# Patient Record
Sex: Female | Born: 1955 | Race: Black or African American | Hispanic: No | Marital: Married | Smoking: Never smoker
Health system: Southern US, Community
[De-identification: ages and names within clinical notes are randomized; demographics above are authoritative.]

## PROBLEM LIST (undated history)

## (undated) DIAGNOSIS — N200 Calculus of kidney: Secondary | ICD-10-CM

## (undated) DIAGNOSIS — I10 Essential (primary) hypertension: Secondary | ICD-10-CM

---

## 2015-07-01 ENCOUNTER — Emergency Department: Payer: BLUE CROSS/BLUE SHIELD

## 2015-07-01 ENCOUNTER — Emergency Department
Admission: EM | Admit: 2015-07-01 | Discharge: 2015-07-02 | Disposition: A | Discharge status: Home / Self Care | Payer: BLUE CROSS/BLUE SHIELD | Attending: Emergency Medicine | Admitting: Emergency Medicine

## 2015-07-01 DIAGNOSIS — I10 Essential (primary) hypertension: Secondary | ICD-10-CM | POA: Insufficient documentation

## 2015-07-01 DIAGNOSIS — R112 Nausea with vomiting, unspecified: Secondary | ICD-10-CM | POA: Diagnosis present

## 2015-07-01 DIAGNOSIS — N200 Calculus of kidney: Secondary | ICD-10-CM | POA: Diagnosis not present

## 2015-07-01 DIAGNOSIS — E876 Hypokalemia: Secondary | ICD-10-CM | POA: Diagnosis present

## 2015-07-01 DIAGNOSIS — R109 Unspecified abdominal pain: Secondary | ICD-10-CM | POA: Insufficient documentation

## 2015-07-01 DIAGNOSIS — N201 Calculus of ureter: Secondary | ICD-10-CM | POA: Diagnosis present

## 2015-07-01 HISTORY — DX: Essential (primary) hypertension: I10

## 2015-07-01 HISTORY — DX: Calculus of kidney: N20.0

## 2015-07-01 LAB — URINALYSIS COMPLETE WITH MICROSCOPIC (ARMC ONLY)
BACTERIA UA: NONE SEEN
Bilirubin Urine: NEGATIVE
Glucose, UA: NEGATIVE mg/dL
HGB URINE DIPSTICK: NEGATIVE
Ketones, ur: NEGATIVE mg/dL
Leukocytes, UA: NEGATIVE
NITRITE: NEGATIVE
PROTEIN: NEGATIVE mg/dL
SPECIFIC GRAVITY, URINE: 1.015 (ref 1.005–1.030)
pH: 9 — ABNORMAL HIGH (ref 5.0–8.0)

## 2015-07-01 LAB — CBC
HEMATOCRIT: 36.2 % (ref 35.0–47.0)
Hemoglobin: 12.2 g/dL (ref 12.0–16.0)
MCH: 32.6 pg (ref 26.0–34.0)
MCHC: 33.6 g/dL (ref 32.0–36.0)
MCV: 96.9 fL (ref 80.0–100.0)
PLATELETS: 206 10*3/uL (ref 150–440)
RBC: 3.73 MIL/uL — ABNORMAL LOW (ref 3.80–5.20)
RDW: 13.5 % (ref 11.5–14.5)
WBC: 6.3 10*3/uL (ref 3.6–11.0)

## 2015-07-01 LAB — COMPREHENSIVE METABOLIC PANEL
ALBUMIN: 4.1 g/dL (ref 3.5–5.0)
ALT: 14 U/L (ref 14–54)
AST: 26 U/L (ref 15–41)
Alkaline Phosphatase: 59 U/L (ref 38–126)
Anion gap: 4 — ABNORMAL LOW (ref 5–15)
BUN: 20 mg/dL (ref 6–20)
CHLORIDE: 108 mmol/L (ref 101–111)
CO2: 25 mmol/L (ref 22–32)
Calcium: 9.2 mg/dL (ref 8.9–10.3)
Creatinine, Ser: 0.9 mg/dL (ref 0.44–1.00)
GFR calc Af Amer: >60 mL/min (ref >60)
GFR calc non Af Amer: >60 mL/min (ref >60)
GLUCOSE: 140 mg/dL — AB (ref 65–99)
POTASSIUM: 3.4 mmol/L — AB (ref 3.5–5.1)
SODIUM: 137 mmol/L (ref 135–145)
Total Bilirubin: 0.6 mg/dL (ref 0.3–1.2)
Total Protein: 7.2 g/dL (ref 6.5–8.1)

## 2015-07-01 LAB — LIPASE, BLOOD: Lipase: 18 U/L (ref 11–51)

## 2015-07-01 MED ORDER — HYDROMORPHONE HCL 1 MG/ML IJ SOLN
INTRAMUSCULAR | Status: AC
Start: 2015-07-01 — End: 2015-07-02
  Administered 2015-07-02: 1 mg
  Filled 2015-07-01: qty 1

## 2015-07-01 MED ORDER — HYDROMORPHONE HCL 1 MG/ML IJ SOLN
0.5000 mg | Freq: Once | INTRAMUSCULAR | Status: AC
  Administered 2015-07-01: 0.5 mg via INTRAVENOUS

## 2015-07-01 MED ORDER — SODIUM CHLORIDE 0.9 % IV BOLUS (SEPSIS)
1000.0000 mL | Freq: Once | INTRAVENOUS | Status: AC
  Administered 2015-07-01: 1000 mL via INTRAVENOUS

## 2015-07-01 MED ORDER — HYDROMORPHONE HCL 1 MG/ML IJ SOLN
1.0000 mg | Freq: Once | INTRAMUSCULAR | Status: AC
  Administered 2015-07-01: 1 mg via INTRAVENOUS
  Filled 2015-07-01: qty 1

## 2015-07-01 MED ORDER — ONDANSETRON HCL 4 MG/2ML IJ SOLN
4.0000 mg | Freq: Once | INTRAMUSCULAR | Status: AC
  Administered 2015-07-01: 4 mg via INTRAVENOUS
  Filled 2015-07-01: qty 2

## 2015-07-01 NOTE — ED Notes (Signed)
Patient transported to CT 

## 2015-07-01 NOTE — ED Notes (Signed)
Pt presents via EMS c/o right flank and back pain. Pt reports similar pain to last kidney stone. Quale MD at bedside.

## 2015-07-02 ENCOUNTER — Encounter: Payer: Self-pay | Admitting: Internal Medicine

## 2015-07-02 DIAGNOSIS — R112 Nausea with vomiting, unspecified: Secondary | ICD-10-CM | POA: Diagnosis present

## 2015-07-02 DIAGNOSIS — N201 Calculus of ureter: Secondary | ICD-10-CM | POA: Diagnosis present

## 2015-07-02 DIAGNOSIS — E876 Hypokalemia: Secondary | ICD-10-CM | POA: Diagnosis present

## 2015-07-02 MED ORDER — ONDANSETRON HCL 4 MG/2ML IJ SOLN
4.0000 mg | Freq: Once | INTRAMUSCULAR | Status: AC
  Administered 2015-07-02: 4 mg via INTRAVENOUS
  Filled 2015-07-02: qty 2

## 2015-07-02 MED ORDER — HYDROCODONE-ACETAMINOPHEN 5-325 MG PO TABS
1.0000 | ORAL_TABLET | Freq: Four times a day (QID) | ORAL | Status: DC | PRN
Start: 2015-07-02 — End: 2015-07-02

## 2015-07-02 MED ORDER — ONDANSETRON 4 MG PO TBDP: 4.0000 mg | ORAL_TABLET | Freq: Four times a day (QID) | ORAL | Status: DC | PRN

## 2015-07-02 MED ORDER — POTASSIUM CHLORIDE 20 MEQ/15ML (10%) PO SOLN
40.0000 meq | Freq: Once | ORAL | Status: AC
  Administered 2015-07-02: 40 meq via ORAL
  Filled 2015-07-02: qty 30

## 2015-07-02 MED ORDER — HYDROMORPHONE HCL 1 MG/ML IJ SOLN
0.5000 mg | INTRAMUSCULAR | Status: DC
Start: 2015-07-02 — End: 2015-07-02
  Filled 2015-07-02: qty 1

## 2015-07-02 MED ORDER — OXYCODONE-ACETAMINOPHEN 5-325 MG PO TABS
1.0000 | ORAL_TABLET | ORAL | Status: AC
  Administered 2015-07-02: 1 via ORAL
  Filled 2015-07-02: qty 1

## 2015-07-02 MED ORDER — ONDANSETRON 4 MG PO TBDP
4.0000 mg | ORAL_TABLET | Freq: Once | ORAL | Status: AC
  Administered 2015-07-02: 4 mg via ORAL
  Filled 2015-07-02: qty 1

## 2015-07-02 NOTE — Discharge Instructions (Signed)
You have been seen in the Emergency Department (ED) today for pain that we believe based on your workup, is caused by kidney stones.  As we have discussed, please drink plenty of fluids.  Please make a follow up appointment with the physician(s) listed elsewhere in this documentation.   We also recommend that you take over-the-counter ibuprofen regularly according to label instructions over the next 5 days.  Take it with meals to minimize stomach discomfort.  Please see your doctor as soon as possible . It appears that your kidney stone has actually passed in the emergency room, and it appears that we see it however to be sure we would like to follow closely with your doctor  Do not drink alcohol, drive or participate in any other potentially dangerous activities while taking opiate pain medication as it may make you sleepy. Do not take this medication with any other sedating medications, either prescription or over-the-counter. If you were prescribed Percocet or Vicodin, do not take these with acetaminophen (Tylenol) as it is already contained within these medications.   This medication is an opiate (or narcotic) pain medication and can be habit forming.  Use it as little as possible to achieve adequate pain control.  Do not use or use it with extreme caution if you have a history of opiate abuse or dependence.  If you are on a pain contract with your primary care doctor or a pain specialist, be sure to let them know you were prescribed this medication today from the [facility].  This medication is intended for your use only - do not give any to anyone else and keep it in a secure place where nobody else, especially children, have access to it.  It will also cause or worsen constipation, so you may want to consider taking an over-the-counter stool softener while you are taking this medication.  Return to the Emergency Department (ED) or call your doctor if you have any  worsening pain, fever, painful urination, are unable to urinate, or develop other symptoms that concern you.   Kidney Stones Kidney stones (urolithiasis) are deposits that form inside your kidneys. The intense pain is caused by the stone moving through the urinary tract. When the stone moves, the ureter goes into spasm around the stone. The stone is usually passed in the urine.  CAUSES   A disorder that makes certain neck glands produce too much parathyroid hormone (primary hyperparathyroidism).  A buildup of uric acid crystals, similar to gout in your joints.  Narrowing (stricture) of the ureter.  A kidney obstruction present at birth (congenital obstruction).  Previous surgery on the kidney or ureters.  Numerous kidney infections. SYMPTOMS   Feeling sick to your stomach (nauseous).  Throwing up (vomiting).  Blood in the urine (hematuria).  Pain that usually spreads (radiates) to the groin.  Frequency or urgency of urination. DIAGNOSIS   Taking a history and physical exam.  Blood or urine tests.  CT scan.  Occasionally, an examination of the inside of the urinary bladder (cystoscopy) is performed. TREATMENT   Observation.  Increasing your fluid intake.  Extracorporeal shock wave lithotripsy--This is a noninvasive procedure that uses shock waves to break up kidney stones.  Surgery may be needed if you have severe pain or persistent obstruction. There are various surgical procedures. Most of the procedures are performed with the use of small instruments. Only small incisions are needed to accommodate these instruments, so recovery time is minimized. The size, location, and  chemical composition are all important variables that will determine the proper choice of action for you. Talk to your health care provider to better understand your situation so that you will minimize the risk of injury to yourself and your kidney.  HOME CARE INSTRUCTIONS   Drink enough water and  fluids to keep your urine clear or pale yellow. This will help you to pass the stone or stone fragments.  Strain all urine through the provided strainer. Keep all particulate matter and stones for your health care provider to see. The stone causing the pain may be as small as a grain of salt. It is very important to use the strainer each and every time you pass your urine. The collection of your stone will allow your health care provider to analyze it and verify that a stone has actually passed. The stone analysis will often identify what you can do to reduce the incidence of recurrences.  Only take over-the-counter or prescription medicines for pain, discomfort, or fever as directed by your health care provider.  Keep all follow-up visits as told by your health care provider. This is important.  Get follow-up X-rays if required. The absence of pain does not always mean that the stone has passed. It may have only stopped moving. If the urine remains completely obstructed, it can cause loss of kidney function or even complete destruction of the kidney. It is your responsibility to make sure X-rays and follow-ups are completed. Ultrasounds of the kidney can show blockages and the status of the kidney. Ultrasounds are not associated with any radiation and can be performed easily in a matter of minutes.  Make changes to your daily diet as told by your health care provider. You may be told to:  Limit the amount of salt that you eat.  Eat 5 or more servings of fruits and vegetables each day.  Limit the amount of meat, poultry, fish, and eggs that you eat.  Collect a 24-hour urine sample as told by your health care provider.You may need to collect another urine sample every 6-12 months. SEEK MEDICAL CARE IF:  You experience pain that is progressive and unresponsive to any pain medicine you have been prescribed. SEEK IMMEDIATE MEDICAL CARE IF:   Pain cannot be controlled with the prescribed  medicine.  You have a fever or shaking chills.  The severity or intensity of pain increases over 18 hours and is not relieved by pain medicine.  You develop a new onset of abdominal pain.  You feel faint or pass out.  You are unable to urinate.   This information is not intended to replace advice given to you by your health care provider. Make sure you discuss any questions you have with your health care provider.   Document Released: 03/31/2005 Document Revised: 12/20/2014 Document Reviewed: 09/01/2012 Elsevier Interactive Patient Education [employer]

## 2015-07-02 NOTE — Consult Note (Signed)
[facility] [place] [facility]   PATIENT NAME: Janice Owens    MR#:  [id number]  DATE OF BIRTH:  1955/07/05  DATE OF ADMISSION:  07/01/2015  PRIMARY CARE PHYSICIAN: No PCP Per Patient   REQUESTING/REFERRING PHYSICIAN: Dr Fanny BienQuale  CHIEF COMPLAINT:   Chief Complaint  Patient presents with  . Flank Pain  . Back Pain    HISTORY OF PRESENT ILLNESS: Janice Owens  is a 60 y.o. female with a known history of Nephrolithiasis and hypertension who presented to the ED today due to sudden onset of right flank pain that started at about 8 PM last night. [place]ology conference and was in a meeting when the pain started. He started on the right flank and was rated at 10 over 10. Pain was called a key, constant and radiated across the flank towards the right groin. Pain was aggravated by urination and was only relieved when she got to the ED and was given Dilaudid. At this time, she is pain-free. She had 4 episodes of vomiting and vomitus contained recently ingested food devoid of blood. She denies any fever, chest pain, shortness of breath, hematuria or changes in bowel habits. She however reports urinary frequency. She denies any fever. She is hypertensive and takes nifedipine. She has not been on hydrochlorothiazide in the past. Her mother has a history of nephrolithiasis.  On arrival in ED, vital signs are stable and CMP was only significant for a potassium of 3.4. Lipase and CBC were normal. Urinalysis showed a pH of 9. CT of the abdomen showed a 3 mm right ureteric stone with moderate proximal obstruction. Patient was given multiple doses of Dilaudid as well as Zofran and oxycodone. Normal saline was also started in the ED. Urology was consulted in the ED and recommended conservative management.  Right after I examined this patient, I was called back to see patient as patient past a calculus in her urine. I  personally observed a calculus and eats was a dark brown 3 mm calculus. Patient feels a lot better at this time and doesn't have any symptoms. She will be discharged from the ED.  PAST MEDICAL HISTORY:   Past Medical History  Diagnosis Date  . Hypertension   . Nephrolithiasis     PAST SURGICAL HISTORY: No past surgical history on file.  SOCIAL HISTORY:  Social History  Substance Use Topics  . Smoking status: Never Smoker   . Smokeless tobacco: Not on file  . Alcohol Use: No    FAMILY HISTORY:  Family History  Problem Relation Age of Onset  . Hypertension Father        . Nephrolithiasis      DRUG ALLERGIES:  Allergies  Allergen Reactions  . Ace Inhibitors Rash    REVIEW OF SYSTEMS:   CONSTITUTIONAL: No fever, fatigue or weakness.  EYES: No blurred or double vision.  EARS, NOSE, AND THROAT: No tinnitus or ear pain.  RESPIRATORY: No cough, shortness of breath, wheezing or hemoptysis.  CARDIOVASCULAR: No chest pain, orthopnea, edema.  GASTROINTESTINAL: As in history of present illness GENITOURINARY: As in history of present illness ENDOCRINE: No polyuria, nocturia,  HEMATOLOGY: No anemia, easy bruising or bleeding SKIN: No rash or lesion. MUSCULOSKELETAL: No joint pain or arthritis.   NEUROLOGIC: No tingling, numbness, weakness.  PSYCHIATRY: No anxiety or depression.   MEDICATIONS AT HOME:  Prior to Admission medications   Medication Sig Start Date End Date  Taking? Authorizing Provider  NIFEdipine (PROCARDIA-XL/ADALAT CC) 30 MG 24 hr tablet Take 30 mg by mouth daily. 04/04/15  Yes Historical Provider, MD  HYDROcodone-acetaminophen (NORCO/VICODIN) 5-325 MG tablet Take 1 tablet by mouth every 6 (six) hours as needed for moderate pain. 07/02/15   Sharyn Creamer, MD  ondansetron (ZOFRAN ODT) 4 MG disintegrating tablet Take 1 tablet (4 mg total) by mouth every 6 (six) hours as needed for nausea or vomiting. 07/02/15   Sharyn Creamer, MD      PHYSICAL EXAMINATION:   VITAL  SIGNS: Blood pressure 130/69, pulse 60, temperature 98.3 F (36.8 C), temperature source Oral, resp. rate 15, height  (1.626 m), weight 70.308 kg (155 lb), SpO2 98 %.  GENERAL:  60 y.o.-year-old patient lying in the bed with no acute distress. Alert and oriented x 3. EYES: Pupils equal, round, reactive to light and accommodation. No scleral icterus. Extraocular muscles intact.  HEENT: Head atraumatic, normocephalic. Oropharynx and nasopharynx clear.  NECK:  Supple, no jugular venous distention. No thyroid enlargement, no tenderness.  LUNGS: Normal breath sounds bilaterally, no wheezing, rales,rhonchi or crepitation. No use of accessory muscles of respiration.  CARDIOVASCULAR: S1, S2 normal. No murmurs, rubs, or gallops.  ABDOMEN: Soft, nontender, nondistended. Bowel sounds present. No organomegaly or mass.  EXTREMITIES: No pedal edema, cyanosis, or clubbing.  NEUROLOGIC: Cranial nerves II through XII are intact. Muscle strength 5/5 in all extremities. Sensation intact. Gait not checked.   SKIN: No obvious rash, lesion, or ulcer.   LABORATORY PANEL:   CBC  Recent Labs Lab 07/01/15 2148  WBC 6.3  HGB 12.2  HCT 36.2  PLT 206  MCV 96.9  MCH 32.6  MCHC 33.6  RDW 13.5   ------------------------------------------------------------------------------------------------------------------  Chemistries   Recent Labs Lab 07/01/15 2148  NA 137  K 3.4*  CL 108  CO2 25  GLUCOSE 140*  BUN 20  CREATININE 0.90  CALCIUM 9.2  AST 26  ALT 14  ALKPHOS 59  BILITOT 0.6   ------------------------------------------------------------------------------------------------------------------ estimated creatinine clearance is 65.5 mL/min (by C-G formula based on Cr of 0.9). ------------------------------------------------------------------------------------------------------------------ No results for input(s): TSH, T4TOTAL, T3FREE, THYROIDAB in the last 72 hours.  Invalid input(s):  FREET3   Coagulation profile No results for input(s): INR, PROTIME in the last 168 hours. ------------------------------------------------------------------------------------------------------------------- No results for input(s): DDIMER in the last 72 hours. -------------------------------------------------------------------------------------------------------------------  Cardiac Enzymes No results for input(s): CKMB, TROPONINI, MYOGLOBIN in the last 168 hours.  Invalid input(s): CK ------------------------------------------------------------------------------------------------------------------ Invalid input(s): POCBNP  ---------------------------------------------------------------------------------------------------------------  Urinalysis    Component Value Date/Time   COLORURINE YELLOW* 07/01/2015 2148   APPEARANCEUR CLOUDY* 07/01/2015 2148   LABSPEC 1.015 07/01/2015 2148   PHURINE 9.0* 07/01/2015 2148   GLUCOSEU NEGATIVE 07/01/2015 2148   HGBUR NEGATIVE 07/01/2015 2148   BILIRUBINUR NEGATIVE 07/01/2015 2148   KETONESUR NEGATIVE 07/01/2015 2148   PROTEINUR NEGATIVE 07/01/2015 2148   NITRITE NEGATIVE 07/01/2015 2148   LEUKOCYTESUR NEGATIVE 07/01/2015 2148     RADIOLOGY: Ct Renal Stone Study  07/01/2015  CLINICAL DATA:  Right flank pain and hematuria. History of kidney stones. EXAM: CT ABDOMEN AND PELVIS WITHOUT CONTRAST TECHNIQUE: Multidetector CT imaging of the abdomen and pelvis was performed following the standard protocol without IV contrast. COMPARISON:  None. FINDINGS: Atelectasis in the lung bases. Right-sided hydronephrosis with moderately severe right hydroureter. 3 mm stone in the distal right ureter at the level of the ureterovesical junction. Additional 2 mm calcifications in the lower pole of the right kidney. Left kidney in ureter are  unremarkable. No bladder wall thickening or bladder stones identified. The spleen appears somewhat fragmented. 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 The unenhanced appearance of the liver, gallbladder, pancreas, adrenal glands, abdominal aorta, and retroperitoneal lymph nodes is unremarkable. Persistent left inferior vena cava. Stomach, small bowel, and colon are not abnormally distended. Small umbilical hernia containing fat. No free air or free fluid in the abdomen. Pelvis: The appendix is normal. Multiple fibroids in the uterus, many with calcification. No free or loculated pelvic fluid collections. No abnormal adnexal masses. Scattered diverticula in the sigmoid colon without evidence of diverticulitis. No destructive bone lesions. Slight anterolisthesis of L4 on L5, likely due to posterior facet joint degenerative change. IMPRESSION: 3 mm stone in the distal right ureter with moderate proximal obstruction. Electronically Signed   By: Burman Nieves M.D.   On: 07/01/2015 22:28    EKG: No orders found for this or any previous visit.  ASSESSMENT  Principal Problem:   Right ureteral calculus Active Problems:   Nausea and vomiting   Hypokalemia  PLAN   1). Proximal right ureteral calculus with significant abdominal pain plus nausea and vomiting - Patient has a history of nephrolithiasis and currently presents with significant flank pain with nausea and vomiting consistent with pain from ureteric calculus. CT scan confirmed a ureteric calculus that measures 3 mm in the right ureteter - Patient was to be admitted for observation but while in the ED, she passed the ureteric stone and is symptom-free at this time. - She wants to be discharged immediately.  - I agree with ED discharge - I have counseled her to drink copious amount of fluids and prevent dehydration in order to prevent recurrence.  2). Hypokalemia - I ordered 40 mEq of potassium in the ED prior to discharge.   All the records are reviewed and case discussed with ED provider. Management plans discussed with the  patient, family and they are in agreement.  CODE STATUS: Code Status History    This patient does not have a recorded code status. Please follow your organizational policy for patients in this situation.      TOTAL TIME TAKING CARE OF THIS PATIENT: 40 minutes.    Robley Fries M.D on 07/02/2015 at 2:29 AM  Between 7am to 6pm - Pager - [phone]  After 6pm go to [email or web address] - password [email or web address] [facility]  [place] [facility]  Office  [phone]  CC: Primary care physician; No PCP Per Patient

## 2015-07-02 NOTE — ED Provider Notes (Signed)
[facility] Emergency Department Provider Note  ____________________________________________  Time seen: Approximately 12:22 AM  I have reviewed the triage vital signs and the nursing notes.   HISTORY  Chief Complaint Flank Pain and Back Pain    HPI Janice DATTILIO is a 60 y.o. female a history of high blood pressure and kidney stone.  Patient reports that about 8 PM today she started experiencing achiness in the right flank, also started nose that she's had a little urgency to go as far as urination for the day. She started to think she may be developing a kidney stone, and then suddenly she had severe right flank pain. She reports it feels the same as a previous kidney stone which she passed.  She reports she began vomiting due to severe nausea and pain. She presently reports severe pain in the right flank. Denies chest pain or trouble breathing.  It is described as a sharp and stabbing pain.  Past Medical History  Diagnosis Date  . Hypertension     There are no active problems to display for this patient.   No past surgical history on file.  Current Outpatient Rx  Name  Route  Sig  Dispense  Refill  . NIFEdipine (PROCARDIA-XL/ADALAT CC) 30 MG 24 hr tablet   Oral   Take 30 mg by mouth daily.      5   . HYDROcodone-acetaminophen (NORCO/VICODIN) 5-325 MG tablet   Oral   Take 1 tablet by mouth every 6 (six) hours as needed for moderate pain.   15 tablet   0   . ondansetron (ZOFRAN ODT) 4 MG disintegrating tablet   Oral   Take 1 tablet (4 mg total) by mouth every 6 (six) hours as needed for nausea or vomiting.   20 tablet   0     Allergies Ace inhibitors  No family history on file.  Social History Social History  Substance Use Topics  . Smoking status: Never Smoker   . Smokeless tobacco: Not on file  . Alcohol Use: No    Review of Systems Constitutional: No fever/chills Eyes: No visual changes. ENT: No sore  throat. Cardiovascular: Denies chest pain. Respiratory: Denies shortness of breath. Gastrointestinal:  No diarrhea.  No constipation. Genitourinary: Negative for dysuriaBut does report an urge to go today, saw some blood in her urine. Musculoskeletal: Negative for back pain. Skin: Negative for rash. Neurological: Negative for headaches, focal weakness or numbness.  Denies pregnancy  10-point ROS otherwise negative.  ____________________________________________   PHYSICAL EXAM:  VITAL SIGNS: ED Triage Vitals  Enc Vitals Group     BP 07/01/15 2341 123/97 mmHg     Pulse Rate 07/01/15 2134 61     Resp 07/01/15 2134 24     Temp 07/01/15 2134 98.3 F (36.8 C)     Temp Source 07/01/15 2134 Oral     SpO2 07/01/15 2134 99 %     Weight 07/01/15 2134 155 lb (70.308 kg)     Height 07/01/15 2134  (1.626 m)     Head Cir --      Peak Flow --      Pain Score 07/01/15 2136 10     Pain Loc --      Pain Edu? --      Excl. in GC? --    Constitutional: Alert and oriented. Patient looks like she is in severe pain, writhing. Eyes: Conjunctivae are normal. PERRL. EOMI. Head: Atraumatic. Nose: No congestion/rhinnorhea. Mouth/Throat: Mucous  membranes are dry.  Oropharynx non-erythematous. Neck: No stridor.   Cardiovascular: Normal rate, regular rhythm. Grossly normal heart sounds.  Good peripheral circulation. Respiratory: Normal respiratory effort.  No retractions. Lungs CTAB. Gastrointestinal: Soft and nontender except for moderate tenderness along the right flank. No distention. No abdominal bruits.  Musculoskeletal: No lower extremity tenderness nor edema.  No joint effusions. Neurologic:  Normal speech and language. No gross focal neurologic deficits are appreciated. Skin:  Skin is warm, dry and intact. No rash noted. Psychiatric: Mood and affect are normal. Speech and behavior are normal.  ____________________________________________   LABS (all labs ordered are listed, but  only abnormal results are displayed)  Labs Reviewed  URINALYSIS COMPLETEWITH MICROSCOPIC ([facility] ONLY) - Abnormal; Notable for the following:    Color, Urine YELLOW (*)    APPearance CLOUDY (*)    pH 9.0 (*)    Squamous Epithelial / LPF 0-5 (*)    All other components within normal limits  CBC - Abnormal; Notable for the following:    RBC 3.73 (*)    All other components within normal limits  COMPREHENSIVE METABOLIC PANEL - Abnormal; Notable for the following:    Potassium 3.4 (*)    Glucose, Bld 140 (*)    Anion gap 4 (*)    All other components within normal limits  LIPASE, BLOOD   ____________________________________________  EKG   ____________________________________________  RADIOLOGY  CT Renal Stone Study (Final result) Result time: 07/01/15 22:28:17   Final result by Rad Results In Interface (07/01/15 22:28:17)   Narrative:   CLINICAL DATA: Right flank pain and hematuria. History of kidney stones.  EXAM: CT ABDOMEN AND PELVIS WITHOUT CONTRAST  TECHNIQUE: Multidetector CT imaging of the abdomen and pelvis was performed following the standard protocol without IV contrast.  COMPARISON: None.  FINDINGS: Atelectasis in the lung bases.  Right-sided hydronephrosis with moderately severe right hydroureter. 3 mm stone in the distal right ureter at the level of the ureterovesical junction. Additional 2 mm calcifications in the lower pole of the right kidney. Left kidney in ureter are unremarkable. No bladder wall thickening or bladder stones identified.  The spleen appears somewhat fragmented. Multiple small accessory spleens are demonstrated. This is likely chronic. The unenhanced appearance of the liver, gallbladder, pancreas, adrenal glands, abdominal aorta, and retroperitoneal lymph nodes is unremarkable. Persistent left inferior vena cava.  Stomach, small bowel, and colon are not abnormally distended. Small umbilical hernia containing fat. No free air  or free fluid in the abdomen.  Pelvis: The appendix is normal. Multiple fibroids in the uterus, many with calcification. No free or loculated pelvic fluid collections. No abnormal adnexal masses. Scattered diverticula in the sigmoid colon without evidence of diverticulitis. No destructive bone lesions. Slight anterolisthesis of L4 on L5, likely due to posterior facet joint degenerative change.  IMPRESSION: 3 mm stone in the distal right ureter with moderate proximal obstruction.   Electronically Signed By: Burman Nieves M.D. On: 07/01/2015 22:28    ____________________________________________   PROCEDURES  Procedure(s) performed: None  Critical Care performed: No  ____________________________________________   INITIAL IMPRESSION / ASSESSMENT AND PLAN / ED COURSE  Pertinent labs & imaging results that were available during my care of the patient were reviewed by me and considered in my medical decision making (see chart for details).  Patient presents for slight urgency, hematuria then followed by severe right flank pain. History of previous kidney stone that passed on its own. She reports feels the same.  Clinical history and exam  seem to suggest possibility of kidney stone is a likely etiology. CT scan performed and confirms a 3 mm obstructing stone on the right, seems to correlate well with patient's symptoms. She has no leukocytosis, fever or evidence of bacteria in her urine. No evidence of sepsis.  ----------------------------------------- 12:53 AM on 07/02/2015 -----------------------------------------  Despite antiemetics, NSAID, opioid pain control 3 the patient continues to have ongoing recurrence of her right flank pain and vomiting. I have paged urology to discuss, anticipate likely admission for pain control at this time has been unable to control well. She did vomit up her Percocet.  Discussed with Dr. Marjory LiesBudzn earlier, who advises pain control but no  acute intervention. Think this is reasonable. Patient continues to have ongoing pain, for which she has been given multiple doses of Dilaudid, given Toradol, multiple doses Zofran and failed her by mouth challenge. We will admit her to the hospitalist service for ongoing. In particular pain control and nausea control given intractable discomfort. Patient agreeable. Currently awake alert reports pain is slowly improving but continues to have ongoing pain and nausea. ____________________________________________   FINAL CLINICAL IMPRESSION(S) / ED DIAGNOSES  Final diagnoses:  Right kidney stone  Intractable pain      Sharyn CreamerMark Quale, MD 07/02/15 [phone]

## 2015-07-02 NOTE — ED Provider Notes (Signed)
-----------------------------------------   3:15 AM on 07/02/2015 -----------------------------------------  Patient was to be admitted for pain control and nausea control with a 3 mm stone however she passed the stone. The hospitalist is asked me to send her home. On exam she is awake and alert tolerating by mouth with no difficulty she states she feels like a new woman and she is eager to go home. Extensive return precautions and follow-up given and understood and patient is eager to be discharged. She understands she must not drive after the medications that she received here. Return precautions and follow-up given and understood.  Jeanmarie PlantJames A Mikey Maffett, MD 07/02/15 [phone]

## 2015-07-02 NOTE — ED Notes (Signed)
Pt may have passed kidney stone when urinating. Observed black object in toil

## 2015-07-02 NOTE — ED Notes (Signed)
[facility]

## 2017-09-19 IMAGING — CT CT RENAL STONE PROTOCOL
1 of 2 series · 15 of 32 positions shown, 19 images · non-contrast
Comparison: None.

CLINICAL DATA: Right flank pain and hematuria. History of kidney
stones.

EXAM:
CT ABDOMEN AND PELVIS WITHOUT CONTRAST
TECHNIQUE: Multidetector CT imaging of the abdomen and pelvis was performed
following the standard protocol without IV contrast.

[Series 2: stone standard full · axial · 0.66mm/px · z∈[-491,-76]mm · 15 of 91 slices shown, 19 images]
[im 4/91  soft-tissue]
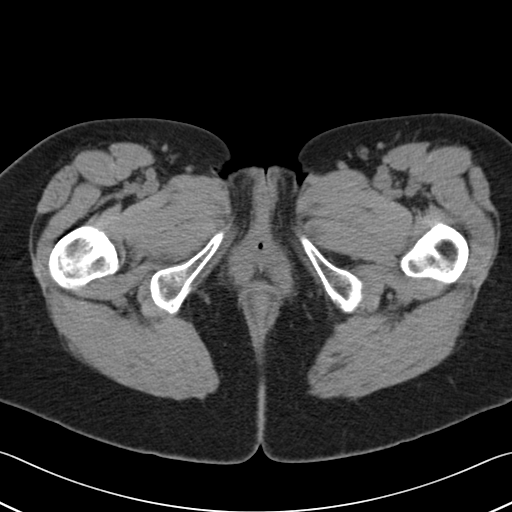
[im 4/91  bone]
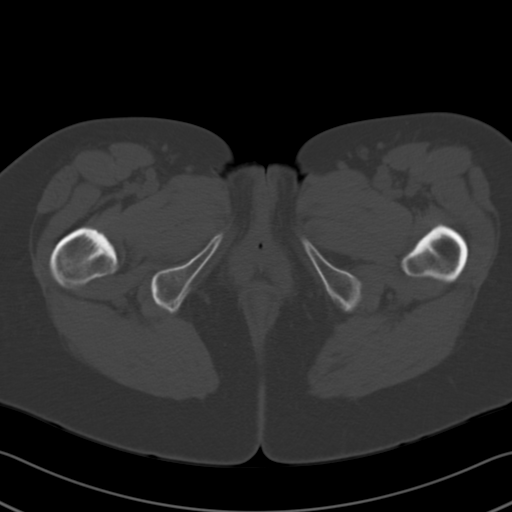
[im 11/91  soft-tissue]
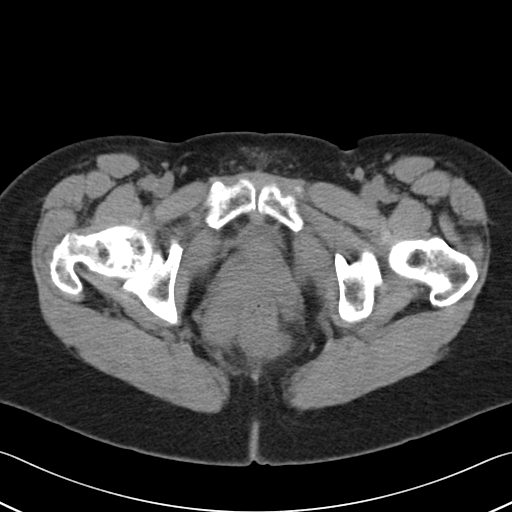
[im 19/91  soft-tissue]
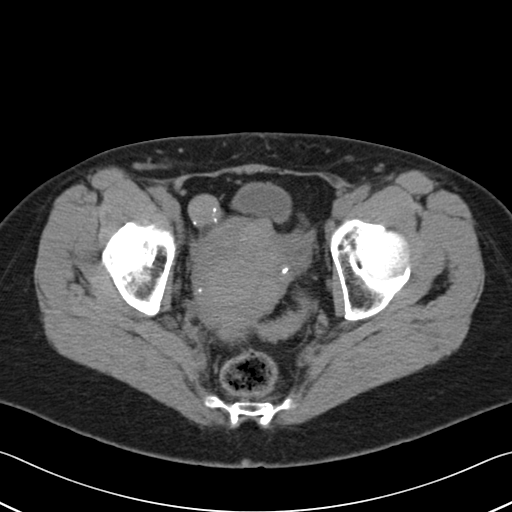
[im 26/91  soft-tissue]
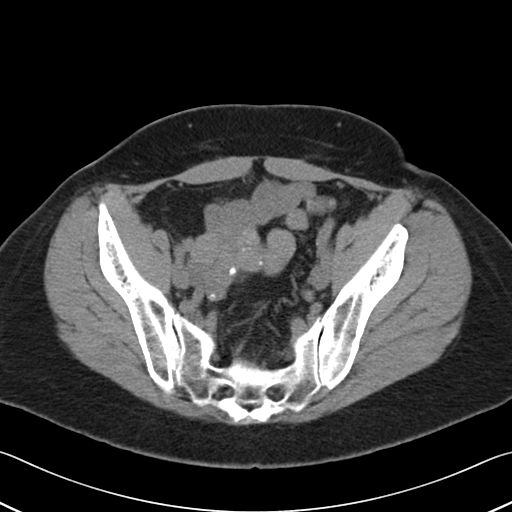
[im 33/91  soft-tissue]
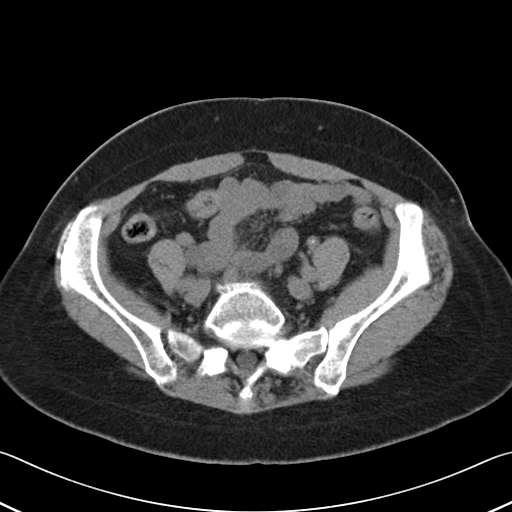
[im 40/91  soft-tissue]
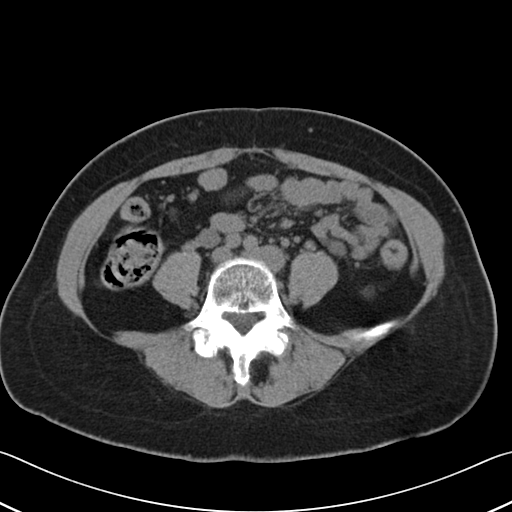
[im 47/91  soft-tissue]
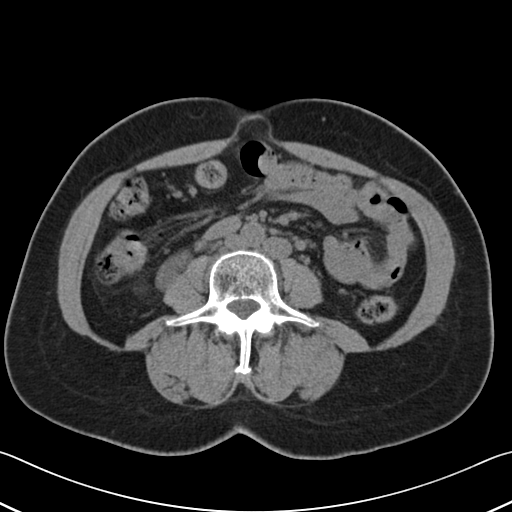
[im 51/91  soft-tissue]
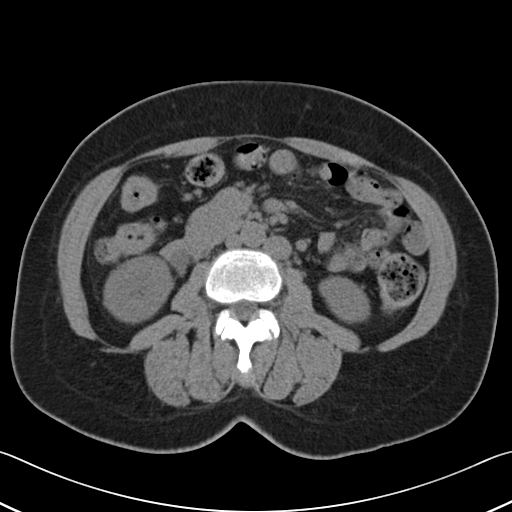
[im 58/91  soft-tissue]
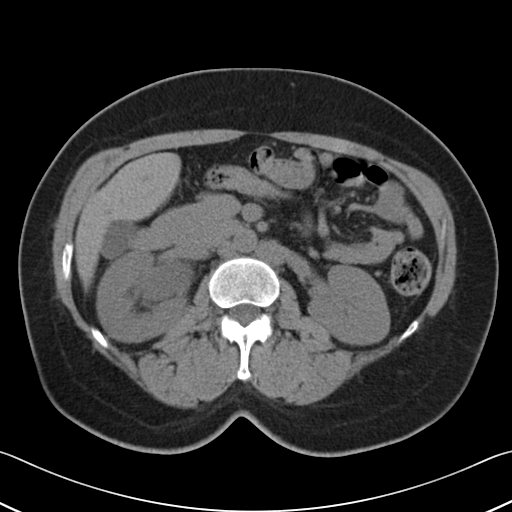
[im 58/91  bone]
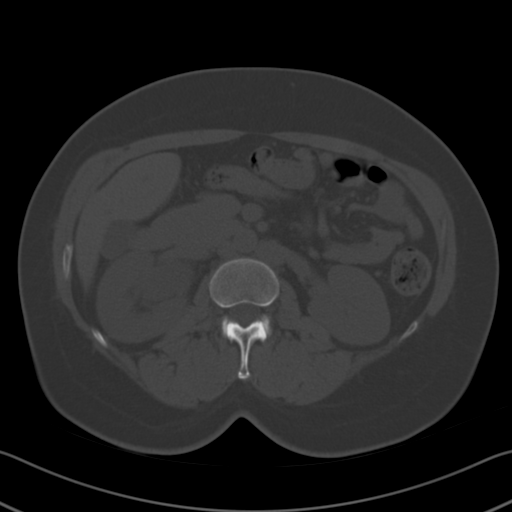
[im 65/91  soft-tissue]
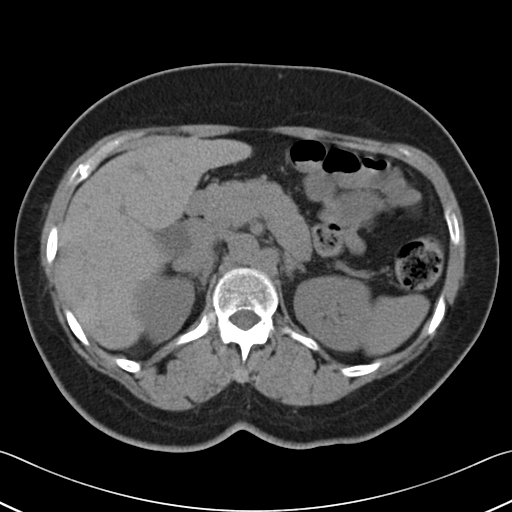
[im 73/91  soft-tissue]
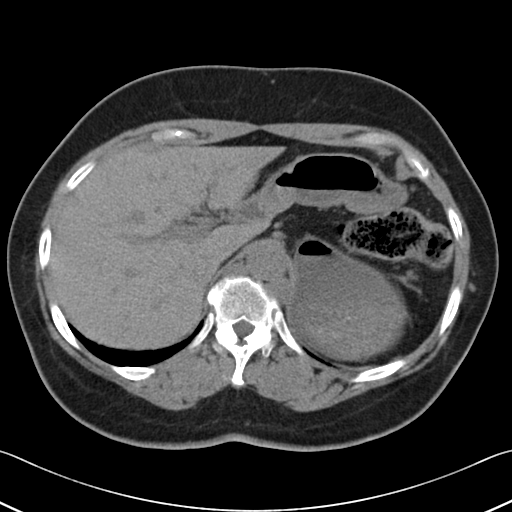
[im 76/91  lung]
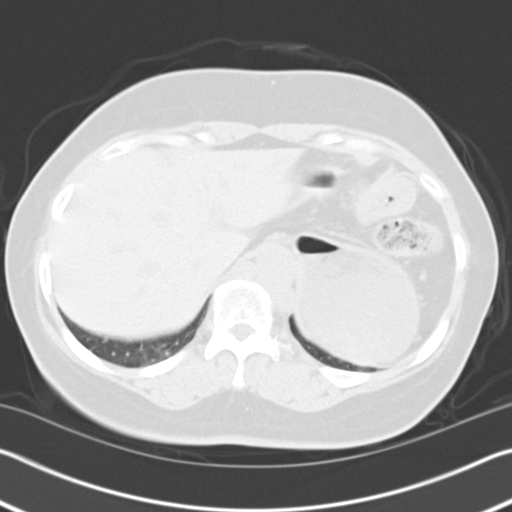
[im 80/91  soft-tissue]
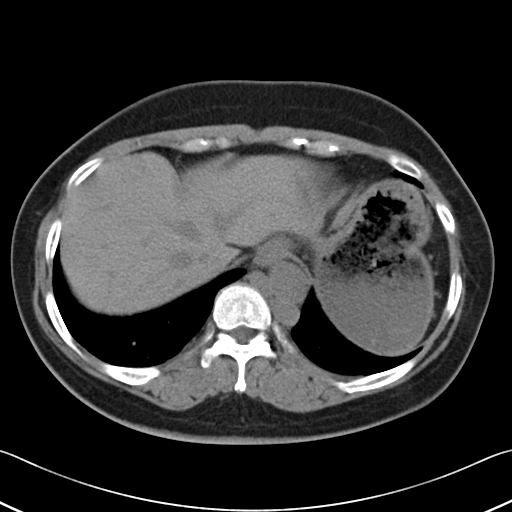
[im 80/91  lung]
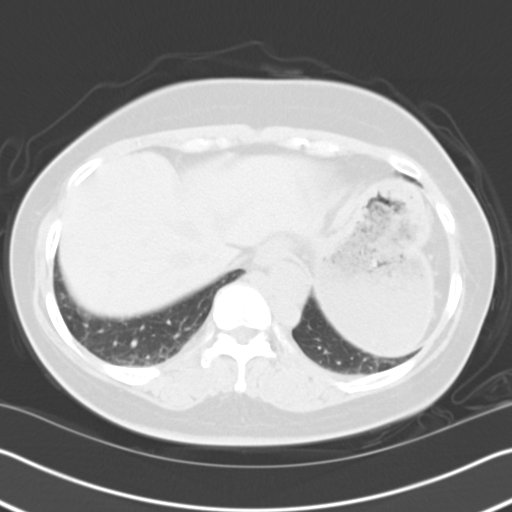
[im 83/91  lung]
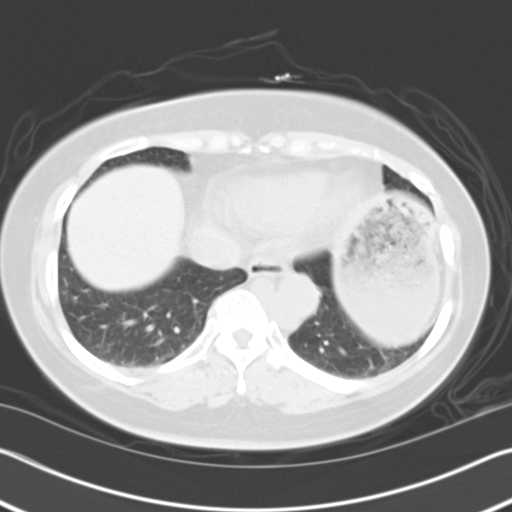
[im 87/91  soft-tissue]
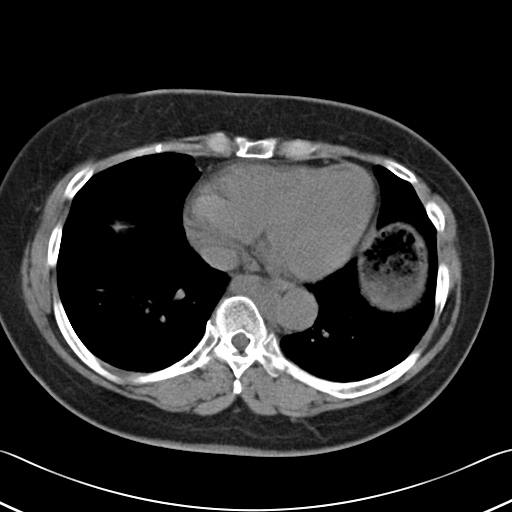
[im 87/91  lung]
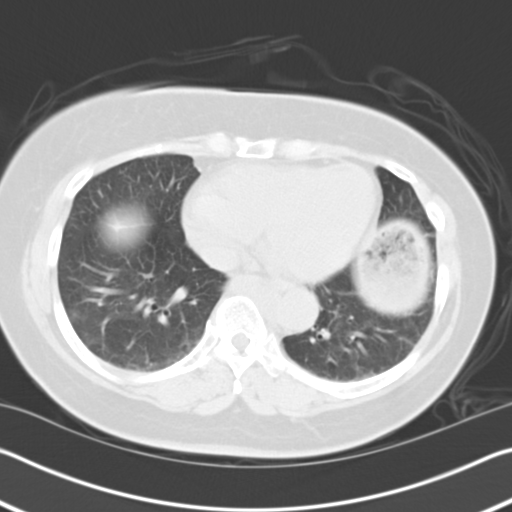

[15 of 32 positions shown; findings below may reference images not displayed]

FINDINGS: Atelectasis in the lung bases.

Right-sided hydronephrosis with moderately severe right hydroureter.
3 mm stone in the distal right ureter at the level of the
ureterovesical junction. Additional 2 mm calcifications in the lower
pole of the right kidney. Left kidney in ureter are unremarkable. No
bladder wall thickening or bladder stones identified.

The spleen appears somewhat fragmented. Multiple small accessory
spleens are demonstrated. This is likely chronic. The unenhanced
appearance of the liver, gallbladder, pancreas, adrenal glands,
abdominal aorta, and retroperitoneal lymph nodes is unremarkable.
Persistent left inferior vena cava.

Stomach, small bowel, and colon are not abnormally distended. Small
umbilical hernia containing fat. No free air or free fluid in the
abdomen.

Pelvis: The appendix is normal. Multiple fibroids in the uterus,
many with calcification. No free or loculated pelvic fluid
collections. No abnormal adnexal masses. Scattered diverticula in
the sigmoid colon without evidence of diverticulitis. No destructive
bone lesions. Slight anterolisthesis of L4 on L5, likely due to
posterior facet joint degenerative change.
IMPRESSION: 3 mm stone in the distal right ureter with moderate proximal
obstruction.
